# Patient Record
Sex: Female | Born: 1995 | Race: Black or African American | Hispanic: No | Marital: Single | Smoking: Never smoker
Health system: Southern US, Community
[De-identification: ages and names within clinical notes are randomized; demographics above are authoritative.]

---

## 2014-03-05 ENCOUNTER — Emergency Department: Payer: Self-pay | Admitting: Emergency Medicine

## 2014-03-05 LAB — CBC WITH DIFFERENTIAL/PLATELET
BASOS PCT: 0.4 %
Basophil #: 0 10*3/uL (ref 0.0–0.1)
Eosinophil #: 0.1 10*3/uL (ref 0.0–0.7)
Eosinophil %: 1.1 %
HCT: 36.5 % (ref 35.0–47.0)
HGB: 11.2 g/dL — ABNORMAL LOW (ref 12.0–16.0)
LYMPHS PCT: 51.7 %
Lymphocyte #: 3.8 10*3/uL — ABNORMAL HIGH (ref 1.0–3.6)
MCH: 22.9 pg — AB (ref 26.0–34.0)
MCHC: 30.6 g/dL — AB (ref 32.0–36.0)
MCV: 75 fL — AB (ref 80–100)
MONOS PCT: 3.7 %
Monocyte #: 0.3 x10 3/mm (ref 0.2–0.9)
Neutrophil #: 3.2 10*3/uL (ref 1.4–6.5)
Neutrophil %: 43.1 %
Platelet: 280 10*3/uL (ref 150–440)
RBC: 4.89 10*6/uL (ref 3.80–5.20)
RDW: 13.7 % (ref 11.5–14.5)
WBC: 7.4 10*3/uL (ref 3.6–11.0)

## 2014-03-05 LAB — BASIC METABOLIC PANEL
ANION GAP: 7 (ref 7–16)
BUN: 10 mg/dL (ref 7–18)
Calcium, Total: 9.1 mg/dL (ref 9.0–10.7)
Chloride: 105 mmol/L (ref 98–107)
Co2: 25 mmol/L (ref 21–32)
Creatinine: 0.74 mg/dL (ref 0.60–1.30)
EGFR (African American): >60
EGFR (Non-African Amer.): >60
Glucose: 101 mg/dL — ABNORMAL HIGH (ref 65–99)
Osmolality: 273 (ref 275–301)
POTASSIUM: 3.8 mmol/L (ref 3.5–5.1)
Sodium: 137 mmol/L (ref 136–145)

## 2014-03-06 LAB — URINALYSIS, COMPLETE
Bilirubin,UR: NEGATIVE
Blood: NEGATIVE
Glucose,UR: NEGATIVE mg/dL (ref 0–75)
Leukocyte Esterase: NEGATIVE
Nitrite: NEGATIVE
Ph: 6 (ref 4.5–8.0)
Protein: 100
RBC,UR: 3 /HPF (ref 0–5)
Specific Gravity: 1.021 (ref 1.003–1.030)

## 2014-03-06 LAB — TSH: Thyroid Stimulating Horm: 1.64 u[IU]/mL

## 2014-04-24 ENCOUNTER — Emergency Department: Payer: Self-pay | Admitting: Emergency Medicine

## 2014-07-16 ENCOUNTER — Emergency Department: Payer: Self-pay | Admitting: Emergency Medicine

## 2015-01-03 ENCOUNTER — Encounter: Payer: Self-pay | Admitting: Emergency Medicine

## 2015-01-03 ENCOUNTER — Emergency Department
Admission: EM | Admit: 2015-01-03 | Discharge: 2015-01-03 | Disposition: A | Discharge status: Home / Self Care | Payer: Medicaid Other | Attending: Emergency Medicine | Admitting: Emergency Medicine

## 2015-01-03 DIAGNOSIS — R1312 Dysphagia, oropharyngeal phase: Secondary | ICD-10-CM | POA: Diagnosis not present

## 2015-01-03 DIAGNOSIS — J029 Acute pharyngitis, unspecified: Secondary | ICD-10-CM | POA: Diagnosis present

## 2015-01-03 DIAGNOSIS — B349 Viral infection, unspecified: Secondary | ICD-10-CM | POA: Diagnosis not present

## 2015-01-03 LAB — POCT RAPID STREP A: Streptococcus, Group A Screen (Direct): NEGATIVE

## 2015-01-03 MED ORDER — MAGIC MOUTHWASH W/LIDOCAINE: 5.0000 mL | Freq: Four times a day (QID) | ORAL | Status: AC

## 2015-01-03 NOTE — Discharge Instructions (Signed)
Take medication as directed.

## 2015-01-03 NOTE — ED Notes (Signed)
C/o sore throat since she woke up this am

## 2015-01-03 NOTE — ED Provider Notes (Signed)
[facility] mergency Department Provider Note  ____________________________________________  Time seen: Approximately 4:04 PM  I have reviewed the triage vital signs and the nursing notes.   HISTORY  Chief Complaint Sore Throat    HPI Alison Miller is a 19 y.o. female awakened this morning with a sore throat. Patient is denying any URI signs and symptoms. Patient states she has pain with swallowing. She is rated the discomfort as a 9/10. He denies any provocative incident for this complaint. Patient has taken no medication for this complaint.   No past medical history on file.  There are no active problems to display for this patient.   No past surgical history on file.  Current Outpatient Rx  Name  Route  Sig  Dispense  Refill  . Alum & Mag Hydroxide-Simeth (MAGIC MOUTHWASH W/LIDOCAINE) SOLN   Oral   Take 5 mLs by mouth 4 (four) times daily.   100 mL   0     Allergies Review of patient's allergies indicates no known allergies.  No family history on file.  Social History History  Substance Use Topics  . Smoking status: Never Smoker   . Smokeless tobacco: Not on file  . Alcohol Use: No    Review of Systems Constitutional: No fever/chills Eyes: No visual changes. ENT: Sore throat increases with swallowing. Cardiovascular: Denies chest pain. Respiratory: Denies shortness of breath. Gastrointestinal: No abdominal pain.  No nausea, no vomiting.  No diarrhea.  No constipation. Genitourinary: Negative for dysuria. Musculoskeletal: Negative for back pain. Skin: Negative for rash. Neurological: Negative for headaches, focal weakness or numbness. 10-point ROS otherwise negative.  ____________________________________________   PHYSICAL EXAM:  VITAL SIGNS: ED Triage Vitals  Enc Vitals Group     BP 01/03/15 1547 105/68 mmHg     Pulse Rate 01/03/15 1547 74     Resp 01/03/15 1547 18     Temp 01/03/15 1547 98.9 F (37.2 C)     Temp  Source 01/03/15 1547 Oral     SpO2 01/03/15 1547 100 %     Weight 01/03/15 1547 118 lb (53.524 kg)     Height 01/03/15 1547 5\' 6"  (1.676 m)     Head Cir --      Peak Flow --      Pain Score 01/03/15 1548 9     Pain Loc --      Pain Edu? --      Excl. in GC? --     Constitutional: Alert and oriented. Well appearing and in no acute distress. Eyes: Conjunctivae are normal. PERRL. EOMI. Head: Atraumatic. Nose: No congestion/rhinnorhea. Mouth/Throat: Mucous membranes are moist.  Oropharynx non-erythematous. Neck: No stridor. Full and equal range of motion. Hematological/Lymphatic/Immunilogical: No cervical lymphadenopathy. Cardiovascular: Normal rate, regular rhythm. Grossly normal heart sounds.  Good peripheral circulation. Respiratory: Normal respiratory effort.  No retractions. Lungs CTAB. Gastrointestinal: Soft and nontender. No distention. No abdominal bruits. No CVA tenderness. Musculoskeletal: No lower extremity tenderness nor edema.  No joint effusions. Neurologic:  Normal speech and language. No gross focal neurologic deficits are appreciated. Speech is normal. No gait instability. Skin:  Skin is warm, dry and intact. No rash noted. Psychiatric: Mood and affect are normal. Speech and behavior are normal.  ____________________________________________   LABS (all labs ordered are listed, but only abnormal results are displayed)  Labs Reviewed  CULTURE, GROUP A STREP ([facility])  POCT RAPID STREP A ([facility] ONLY)  POCT RAPID STREP A ([facility] ONLY)  ____________________________________________  EKG   ____________________________________________  RADIOLOGY   ____________________________________________   PROCEDURES  Procedure(s) performed: None  Critical Care performed: No  ____________________________________________   INITIAL IMPRESSION / ASSESSMENT AND PLAN / ED COURSE  Pertinent labs & imaging results that were available during my care of the  patient were reviewed by me and considered in my medical decision making (see chart for details).  Pharyngitis ____________________________________________   FINAL CLINICAL IMPRESSION(S) / ED DIAGNOSES  Final diagnoses:  Pharyngitis with viral syndrome  Dysphagia, oropharyngeal      Joni ReiningRonald K Marg Macmaster, PA-C 01/03/15 1636  Loleta Roseory Forbach, MD 01/03/15 2332

## 2015-01-03 NOTE — ED Notes (Signed)
C/o sorethroat since last pm

## 2015-01-06 LAB — CULTURE, GROUP A STREP (THRC)

## 2015-05-28 ENCOUNTER — Emergency Department
Admission: EM | Admit: 2015-05-28 | Discharge: 2015-05-28 | Disposition: A | Discharge status: Home / Self Care | Payer: Medicaid Other | Attending: Emergency Medicine | Admitting: Emergency Medicine

## 2015-05-28 ENCOUNTER — Encounter: Payer: Self-pay | Admitting: Emergency Medicine

## 2015-05-28 DIAGNOSIS — J302 Other seasonal allergic rhinitis: Secondary | ICD-10-CM | POA: Insufficient documentation

## 2015-05-28 DIAGNOSIS — R05 Cough: Secondary | ICD-10-CM | POA: Diagnosis present

## 2015-05-28 DIAGNOSIS — K529 Noninfective gastroenteritis and colitis, unspecified: Secondary | ICD-10-CM | POA: Diagnosis not present

## 2015-05-28 MED ORDER — LOPERAMIDE HCL 2 MG PO CAPS
2.0000 mg | ORAL_CAPSULE | Freq: Once | ORAL | Status: AC
  Administered 2015-05-28: 2 mg via ORAL
  Filled 2015-05-28: qty 1

## 2015-05-28 MED ORDER — ONDANSETRON 4 MG PO TBDP
4.0000 mg | ORAL_TABLET | Freq: Once | ORAL | Status: AC
  Administered 2015-05-28: 4 mg via ORAL
  Filled 2015-05-28: qty 1

## 2015-05-28 MED ORDER — ONDANSETRON HCL 4 MG PO TABS: 4.0000 mg | ORAL_TABLET | Freq: Four times a day (QID) | ORAL | Status: AC | PRN

## 2015-05-28 NOTE — ED Notes (Signed)
AAOx3.  Skin warm and dry.  NAD 

## 2015-05-28 NOTE — ED Notes (Signed)
Pt states she has had cold symptoms since Sunday. Per patient she has a sore throat and it is painful to swallow. Pt also c/o runny nose and constant sneezing.

## 2015-05-28 NOTE — ED Provider Notes (Signed)
[facility] Emergency Department Provider Note ____________________________________________  Time seen: 46  I have reviewed the triage vital signs and the nursing notes.  HISTORY  Chief Complaint  URI  HPI Quetzalli Clos is a 19 y.o. female reports to the ED for evaluation of cold symptoms onset since Sunday. She describes intermittent sneezing, coughing, sore throat, and runny nose. She also reports today she had an episode of spontaneous diarrhea, or resting on the couch. She has dosed DayQuil for her symptom relief 1 dose yesterday. She is without any fevers, chills, sweats, or shortness of breath. She denies any sick contacts, recent travel, or bad foods. She is here today for evaluation of her symptoms.She rates her discomfort, which is primarily in the throat at a 5/10 in triage.  History reviewed. No pertinent past medical history.  There are no active problems to display for this patient.  History reviewed. No pertinent past surgical history.  Current Outpatient Rx  Name  Route  Sig  Dispense  Refill  . ondansetron (ZOFRAN) 4 MG tablet   Oral   Take 1 tablet (4 mg total) by mouth every 6 (six) hours as needed for nausea or vomiting.   15 tablet   0    Allergies Review of patient's allergies indicates no known allergies.  History reviewed. No pertinent family history.  Social History Social History  Substance Use Topics  . Smoking status: Never Smoker   . Smokeless tobacco: None  . Alcohol Use: Yes     Comment: Occasionally   Review of Systems  Constitutional: Negative for fever. Eyes: Negative for visual changes. ENT: Positive for sore throat and rhinitis as above. Cardiovascular: Negative for chest pain. Respiratory: Negative for shortness of breath. Gastrointestinal: Negative for abdominal pain and vomiting; reports nausea and diarrhea. Genitourinary: Negative for dysuria. Musculoskeletal: Negative for back pain. Skin: Negative  for rash. Neurological: Negative for headaches, focal weakness or numbness. ____________________________________________  PHYSICAL EXAM:  VITAL SIGNS: ED Triage Vitals  Enc Vitals Group     BP 05/28/15 1823 113/62 mmHg     Pulse Rate 05/28/15 1823 78     Resp 05/28/15 1823 20     Temp 05/28/15 1823 98.9 F (37.2 C)     Temp Source 05/28/15 1823 Oral     SpO2 05/28/15 1823 100 %     Weight 05/28/15 1823 123 lb (55.792 kg)     Height 05/28/15 1823  (1.702 m)     Head Cir --      Peak Flow --      Pain Score 05/28/15 1824 5     Pain Loc --      Pain Edu? --      Excl. in GC? --    Constitutional: Alert and oriented. Well appearing and in no distress. Eyes: Conjunctivae are normal. PERRL. Normal extraocular movements. ENT   Head: Normocephalic and atraumatic.   Nose: No congestion/rhinorrhea.   Mouth/Throat: Mucous membranes are moist.   Neck: Supple. No thyromegaly. Hematological/Lymphatic/Immunological: No cervical lymphadenopathy. Cardiovascular: Normal rate, regular rhythm.  Respiratory: Normal respiratory effort. No wheezes/rales/rhonchi. Gastrointestinal: Soft and nontender. No distention, rebound, guarding, organomegaly. Hyperactive bowel sounds 4 quadrants. Musculoskeletal: Nontender with normal range of motion in all extremities.  Neurologic:  Normal gait without ataxia. Normal speech and language. No gross focal neurologic deficits are appreciated. Skin:  Skin is warm, dry and intact. No rash noted. Psychiatric: Mood and affect are normal. Patient exhibits appropriate insight and judgment. ____________________________________________  PROCEDURES  Imodium 2 mg p.o. Zofran 4 mg ODT p.o. ____________________________________________  INITIAL IMPRESSION / ASSESSMENT AND PLAN / ED COURSE  Acute gastroenteritis symptoms with onset today. Other symptoms consistent with rhinitis and seasonal allergies likely. Patient to dose over-the-counter allergy  medication for symptom relief. She is provided with a prescription for Zofran as needed for nausea, and advised to dose over-the-counter Imodium AD as needed for ongoing diarrhea symptoms. She tried with a school note to remain out of school until Monday. ____________________________________________  FINAL CLINICAL IMPRESSION(S) / ED DIAGNOSES  Final diagnoses:  Other seasonal allergic rhinitis  Gastroenteritis, acute      Lissa Hoard, PA-C 05/28/15 1921  Darien Ramus, MD 05/28/15 2352

## 2015-05-28 NOTE — Discharge Instructions (Signed)
Allergies An allergy is when your body reacts to a substance in a way that is not normal. An allergic reaction can happen after you:  Eat something.  Breathe in something.  Touch something. WHAT KINDS OF ALLERGIES ARE THERE? You can be allergic to:  Things that are only around during certain seasons, like molds and pollens.  Foods.  Drugs.  Insects.  Animal dander. WHAT ARE SYMPTOMS OF ALLERGIES?  Puffiness (swelling). This may happen on the lips, face, tongue, mouth, or throat.  Sneezing.  Coughing.  Breathing loudly (wheezing).  Stuffy nose.  Tingling in the mouth.  A rash.  Itching.  Itchy, red, puffy areas of skin (hives).  Watery eyes.  Throwing up (vomiting).  Watery poop (diarrhea).  Dizziness.  Feeling faint or fainting.  Trouble breathing or swallowing.  A tight feeling in the chest.  A fast heartbeat. HOW ARE ALLERGIES DIAGNOSED? Allergies can be diagnosed with:  A medical and family history.  Skin tests.  Blood tests.  A food diary. A food diary is a record of all the foods, drinks, and symptoms you have each day.  The results of an elimination diet. This diet involves making sure not to eat certain foods and then seeing what happens when you start eating them again. HOW ARE ALLERGIES TREATED? There is no cure for allergies, but allergic reactions can be treated with medicine. Severe reactions usually need to be treated at a hospital.  HOW CAN REACTIONS BE PREVENTED? The best way to prevent an allergic reaction is to avoid the thing you are allergic to. Allergy shots and medicines can also help prevent reactions in some cases.   This information is not intended to replace advice given to you by your health care provider. Make sure you discuss any questions you have with your health care provider.   Document Released: 12/04/2012 Document Revised: 08/30/2014 Document Reviewed: 05/21/2014 Elsevier Interactive Patient Education 2016  [employer].  Food Choices to Help Relieve Diarrhea, Adult When you have diarrhea, the foods you eat and your eating habits are very important. Choosing the right foods and drinks can help relieve diarrhea. Also, because diarrhea can last up to 7 days, you need to replace lost fluids and electrolytes (such as sodium, potassium, and chloride) in order to help prevent dehydration.  WHAT GENERAL GUIDELINES DO I NEED TO FOLLOW?  Slowly drink 1 cup (8 oz) of fluid for each episode of diarrhea. If you are getting enough fluid, your urine will be clear or pale yellow.  Eat starchy foods. Some good choices include white rice, white toast, pasta, low-fiber cereal, baked potatoes (without the skin), saltine crackers, and bagels.  Avoid large servings of any cooked vegetables.  Limit fruit to two servings per day. A serving is  cup or 1 small piece.  Choose foods with less than 2 g of fiber per serving.  Limit fats to less than 8 tsp (38 g) per day.  Avoid fried foods.  Eat foods that have probiotics in them. Probiotics can be found in certain dairy products.  Avoid foods and beverages that may increase the speed at which food moves through the stomach and intestines (gastrointestinal tract). Things to avoid include:  High-fiber foods, such as dried fruit, raw fruits and vegetables, nuts, seeds, and whole grain foods.  Spicy foods and high-fat foods.  Foods and beverages sweetened with high-fructose corn syrup, honey, or sugar alcohols such as xylitol, sorbitol, and mannitol. WHAT FOODS ARE RECOMMENDED? Grains White rice. White,  [place], or pita breads (fresh or toasted), including plain rolls, buns, or bagels. White pasta. Saltine, soda, or graham crackers. Pretzels. Low-fiber cereal. Cooked cereals made with water (such as cornmeal, farina, or cream cereals). Plain muffins. Matzo. Melba toast. Zwieback.  Vegetables Potatoes (without the skin). Strained tomato and vegetable juices. Most  well-cooked and canned vegetables without seeds. Tender lettuce. Fruits Cooked or canned applesauce, apricots, cherries, fruit cocktail, grapefruit, peaches, pears, or plums. Fresh bananas, apples without skin, cherries, grapes, cantaloupe, grapefruit, peaches, oranges, or plums.  Meat and Other Protein Products Baked or boiled chicken. Eggs. Tofu. Fish. Seafood. Smooth peanut butter. Ground or well-cooked tender beef, ham, veal, lamb, pork, or poultry.  Dairy Plain yogurt, kefir, and unsweetened liquid yogurt. Lactose-free milk, buttermilk, or soy milk. Plain hard cheese. Beverages Sport drinks. Clear broths. Diluted fruit juices (except prune). Regular, caffeine-free sodas such as ginger ale. Water. Decaffeinated teas. Oral rehydration solutions. Sugar-free beverages not sweetened with sugar alcohols. Other Bouillon, broth, or soups made from recommended foods.  The items listed above may not be a complete list of recommended foods or beverages. Contact your dietitian for more options. WHAT FOODS ARE NOT RECOMMENDED? Grains Whole grain, whole wheat, bran, or rye breads, rolls, pastas, crackers, and cereals. Wild or brown rice. Cereals that contain more than 2 g of fiber per serving. Corn tortillas or taco shells. Cooked or dry oatmeal. Granola. Popcorn. Vegetables Raw vegetables. Cabbage, broccoli, Brussels sprouts, artichokes, baked beans, beet greens, corn, kale, legumes, peas, sweet potatoes, and yams. Potato skins. Cooked spinach and cabbage. Fruits Dried fruit, including raisins and dates. Raw fruits. Stewed or dried prunes. Fresh apples with skin, apricots, mangoes, pears, raspberries, and strawberries.  Meat and Other Protein Products Chunky peanut butter. Nuts and seeds. Beans and lentils. Tomasa Blase.  Dairy High-fat cheeses. Milk, chocolate milk, and beverages made with milk, such as milk shakes. Cream. Ice cream. Sweets and Desserts Sweet rolls, doughnuts, and sweet breads. Pancakes  and waffles. Fats and Oils Butter. Cream sauces. Margarine. Salad oils. Plain salad dressings. Olives. Avocados.  Beverages Caffeinated beverages (such as coffee, tea, soda, or energy drinks). Alcoholic beverages. Fruit juices with pulp. Prune juice. Soft drinks sweetened with high-fructose corn syrup or sugar alcohols. Other Coconut. Hot sauce. Chili powder. Mayonnaise. Gravy. Cream-based or milk-based soups.  The items listed above may not be a complete list of foods and beverages to avoid. Contact your dietitian for more information. WHAT SHOULD I DO IF I BECOME DEHYDRATED? Diarrhea can sometimes lead to dehydration. Signs of dehydration include dark urine and dry mouth and skin. If you think you are dehydrated, you should rehydrate with an oral rehydration solution. These solutions can be purchased at pharmacies, retail stores, or online.  Drink -1 cup (120-240 mL) of oral rehydration solution each time you have an episode of diarrhea. If drinking this amount makes your diarrhea worse, try drinking smaller amounts more often. For example, drink 1-3 tsp (5-15 mL) every 5-10 minutes.  A general rule for staying hydrated is to drink 1-2 L of fluid per day. Talk to your health care provider about the specific amount you should be drinking each day. Drink enough fluids to keep your urine clear or pale yellow.   This information is not intended to replace advice given to you by your health care provider. Make sure you discuss any questions you have with your health care provider.   Document Released: 10/30/2003 Document Revised: 08/30/2014 Document Reviewed: 07/02/2013 Elsevier Interactive Patient Education 2016 Elsevier  Inc. ° °

## 2015-05-28 NOTE — ED Notes (Signed)
AAOx3.  Skin warm and dry.  Moving all extremities.  Ambulates with easy and steady gait.

## 2015-12-10 ENCOUNTER — Emergency Department: Payer: Medicaid Other

## 2015-12-10 ENCOUNTER — Encounter: Payer: Self-pay | Admitting: Medical Oncology

## 2015-12-10 ENCOUNTER — Emergency Department
Admission: EM | Admit: 2015-12-10 | Discharge: 2015-12-10 | Disposition: A | Discharge status: Home / Self Care | Payer: Medicaid Other | Attending: Student | Admitting: Student

## 2015-12-10 DIAGNOSIS — Y999 Unspecified external cause status: Secondary | ICD-10-CM | POA: Insufficient documentation

## 2015-12-10 DIAGNOSIS — Z791 Long term (current) use of non-steroidal anti-inflammatories (NSAID): Secondary | ICD-10-CM | POA: Diagnosis not present

## 2015-12-10 DIAGNOSIS — Y92009 Unspecified place in unspecified non-institutional (private) residence as the place of occurrence of the external cause: Secondary | ICD-10-CM | POA: Diagnosis not present

## 2015-12-10 DIAGNOSIS — W2201XA Walked into wall, initial encounter: Secondary | ICD-10-CM | POA: Diagnosis not present

## 2015-12-10 DIAGNOSIS — S96911A Strain of unspecified muscle and tendon at ankle and foot level, right foot, initial encounter: Secondary | ICD-10-CM | POA: Diagnosis not present

## 2015-12-10 DIAGNOSIS — S99921A Unspecified injury of right foot, initial encounter: Secondary | ICD-10-CM | POA: Diagnosis present

## 2015-12-10 DIAGNOSIS — Y9302 Activity, running: Secondary | ICD-10-CM | POA: Insufficient documentation

## 2015-12-10 MED ORDER — MELOXICAM 15 MG PO TABS: 15.0000 mg | ORAL_TABLET | Freq: Every day | ORAL | Status: AC

## 2015-12-10 NOTE — ED Notes (Signed)
PT injured pinky toe on rt foot today.

## 2015-12-10 NOTE — ED Provider Notes (Signed)
[facility] Emergency Department Provider Note  ____________________________________________  Time seen: Approximately 2:25 PM  I have reviewed the triage vital signs and the nursing notes.   HISTORY  Chief Complaint Toe Injury    HPI Alison Miller is a 20 y.o. female complaining of right 5th toe pain following an injury incurred this morning. The patient states she was running through her home and kicked a wall. She admits to sharp pain, numbness and tingling in the affected toe and difficulty walking. She denies falling from the injury or head trauma, ankle pain, calf pain or previous injury to the affected foot.   History reviewed. No pertinent past medical history.  There are no active problems to display for this patient.   History reviewed. No pertinent past surgical history.  Current Outpatient Rx  Name  Route  Sig  Dispense  Refill  . meloxicam (MOBIC) 15 MG tablet   Oral   Take 1 tablet (15 mg total) by mouth daily.   30 tablet   0   . ondansetron (ZOFRAN) 4 MG tablet   Oral   Take 1 tablet (4 mg total) by mouth every 6 (six) hours as needed for nausea or vomiting.   15 tablet   0     Allergies Review of patient's allergies indicates no known allergies.  No family history on file.  Social History Social History  Substance Use Topics  . Smoking status: Never Smoker   . Smokeless tobacco: None  . Alcohol Use: Yes     Comment: Occasionally    Review of Systems  Musculoskeletal: Negative for left lower extremity pain, right ankle pain, right knee pain. Positive for left foot pain localized to the 5th toe. Skin: negative for bruising of the right foot.  Neurological: Negative for headaches, focal weakness or numbness. Some numbness in the right 5th toe 10-point ROS otherwise negative.  ____________________________________________   PHYSICAL EXAM:  VITAL SIGNS: ED Triage Vitals  Enc Vitals Group     BP 12/10/15 1328  111/60 mmHg     Pulse Rate 12/10/15 1328 64     Resp 12/10/15 1328 16     Temp 12/10/15 1328 98.4 F (36.9 C)     Temp Source 12/10/15 1328 Oral     SpO2 12/10/15 1328 100 %     Weight 12/10/15 1328 125 lb (56.7 kg)     Height 12/10/15 1328 5\' 6"  (1.676 m)     Head Cir --      Peak Flow --      Pain Score 12/10/15 1327 9     Pain Loc --      Pain Edu? --      Excl. in GC? --     Constitutional: Alert and oriented. Well appearing and in no acute distress. Musculoskeletal: No lower extremity tenderness nor edema.  No joint effusions. Mild swelling noted in the right 5th toe, no bruising or obvious bony abnormalities.  Neurologic:  Normal speech and language. No gross focal neurologic deficits are appreciated.  Skin:  Skin is warm, dry and intact. No rash noted. Psychiatric: Mood and affect are normal. Speech and behavior are normal.  ____________________________________________   LABS (all labs ordered are listed, but only abnormal results are displayed)  Labs Reviewed - No data to display  RADIOLOGY  Right Foot Complete 3+ view- negative for acute bony abnormality ____________________________________________   PROCEDURES  Procedure(s) performed: Radiology, see procedure note(s).  Critical Care performed: No  ____________________________________________  INITIAL IMPRESSION / ASSESSMENT AND PLAN / ED COURSE  Pertinent labs & imaging results that were available during my care of the patient were reviewed by me and considered in my medical decision making (see chart for details).  [name] is a 20 yo female presenting with right 5th toe pain following an injury earlier this morning. Right foot complete imaging shows no acute bony abnormalities. Right 5th toe strain following injury. Rx given for meloxicam 50 mg daily. Patient follow-up with PCP or return to the ER with any worsening symptomology. ____________________________________________   FINAL CLINICAL  IMPRESSION(S) / ED DIAGNOSES  Final diagnoses:  Strain of fifth toe, right, initial encounter     Evangeline Dakin, PA-C 12/10/15 1722  Emily Filbert, MD 12/10/15 [phone]

## 2015-12-10 NOTE — Discharge Instructions (Signed)

## 2016-05-06 IMAGING — CT CT HEAD WITHOUT CONTRAST
1 series · 16 of 30 positions shown, 20 images · non-contrast
Comparison: None.

CLINICAL DATA: Syncope with recent trauma; headache

EXAM:
CT HEAD WITHOUT CONTRAST
TECHNIQUE: Contiguous axial images were obtained from the base of the skull
through the vertex without intravenous contrast.

[Series 2: head wo · axial · 0.39mm/px · z∈[+436,+562]mm · 16 of 32 slices shown, 20 images]
[im 2/32  brain]
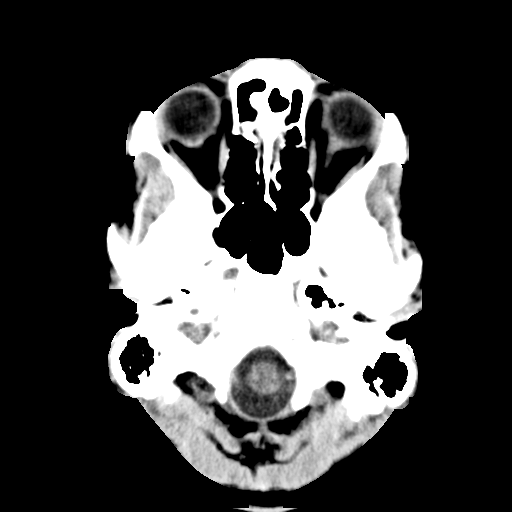
[im 2/32  bone]
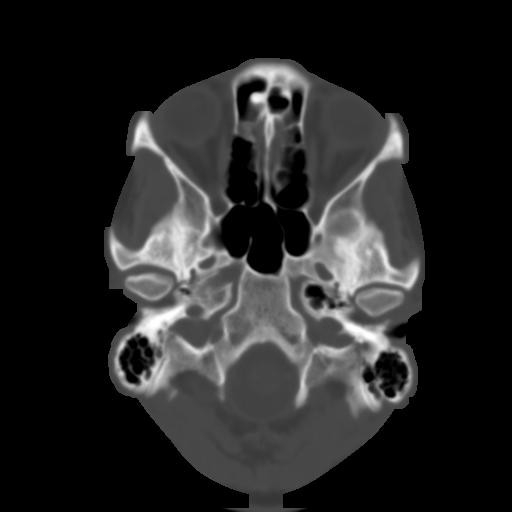
[im 4/32  brain]
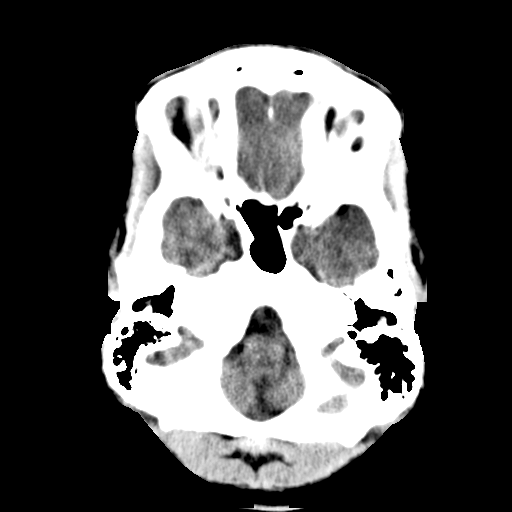
[im 6/32  brain]
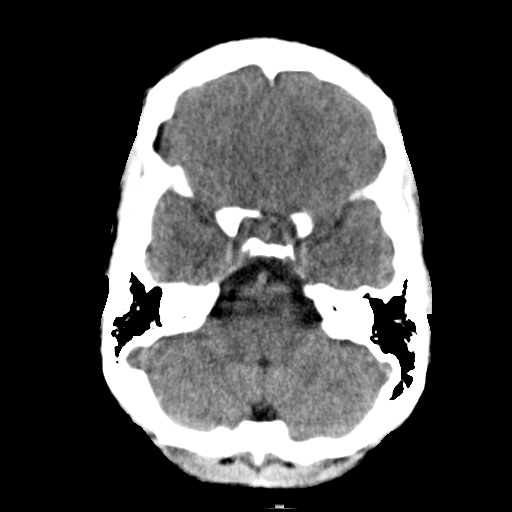
[im 8/32  brain]
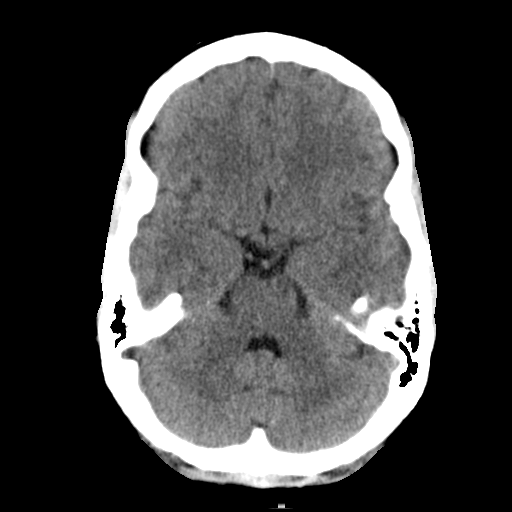
[im 9/32  brain]
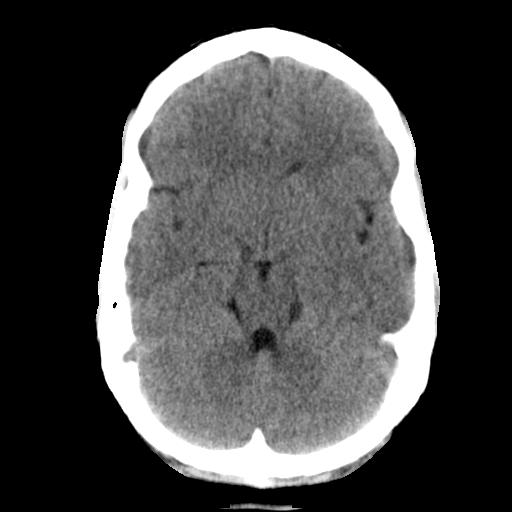
[im 9/32  bone]
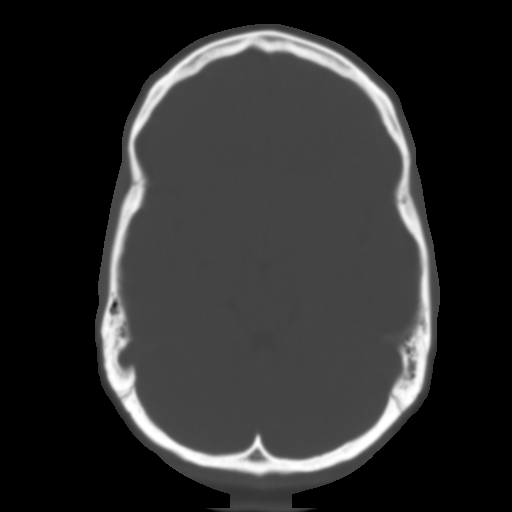
[im 11/32  brain]
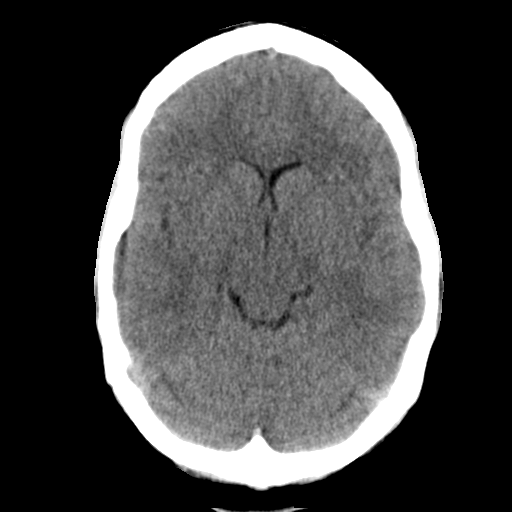
[im 13/32  brain]
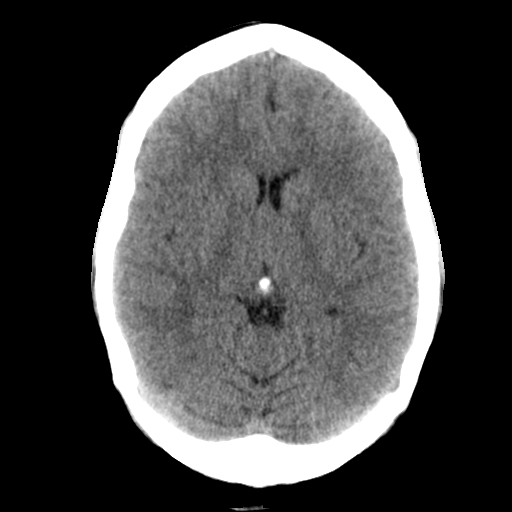
[im 15/32  brain]
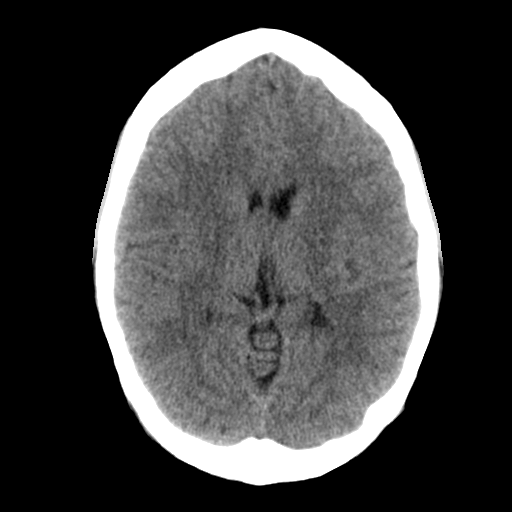
[im 17/32  brain]
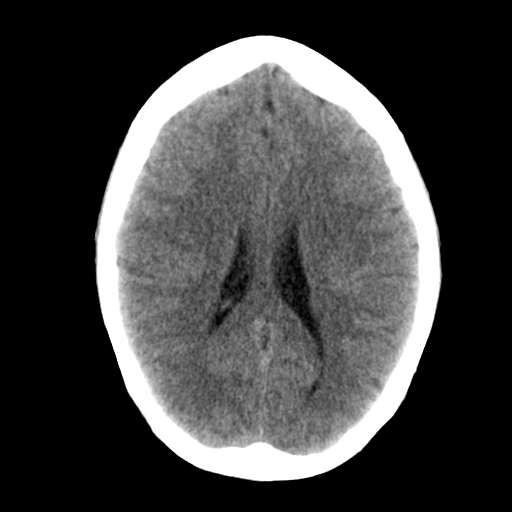
[im 17/32  bone]
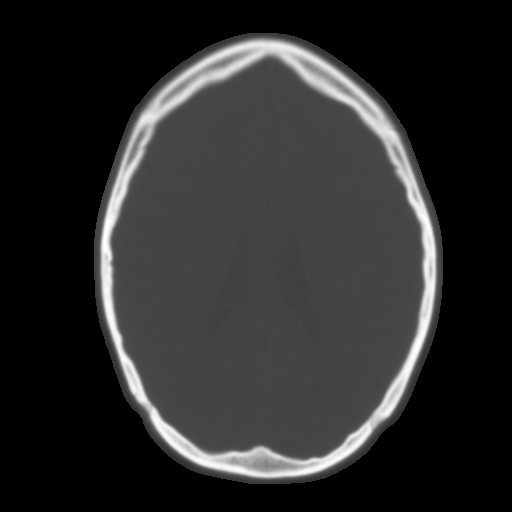
[im 19/32  brain]
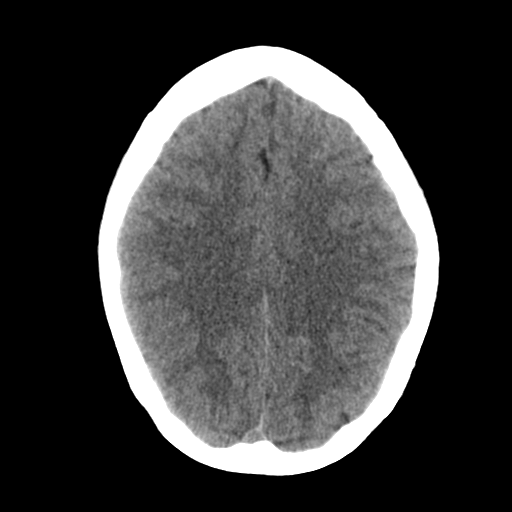
[im 21/32  brain]
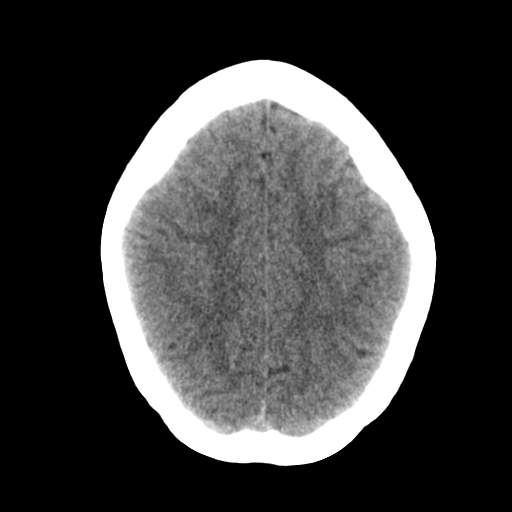
[im 23/32  brain]
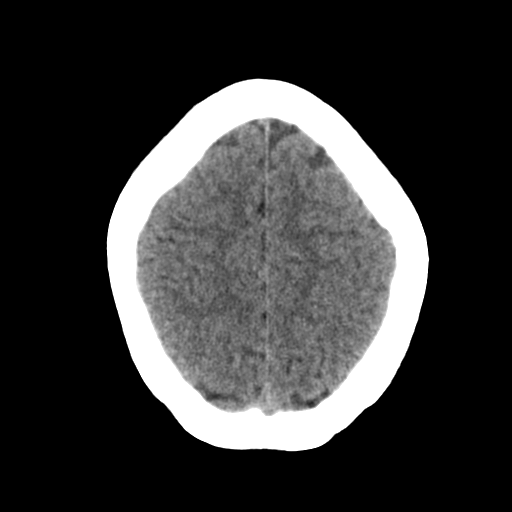
[im 24/32  brain]
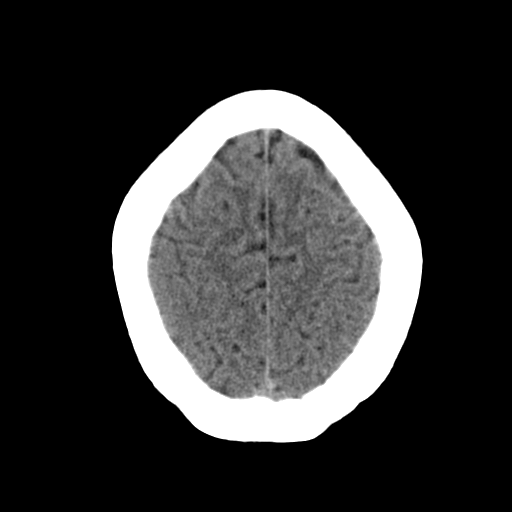
[im 24/32  bone]
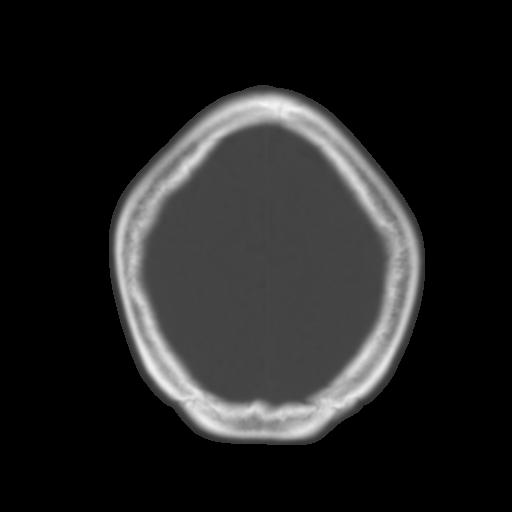
[im 26/32  brain]
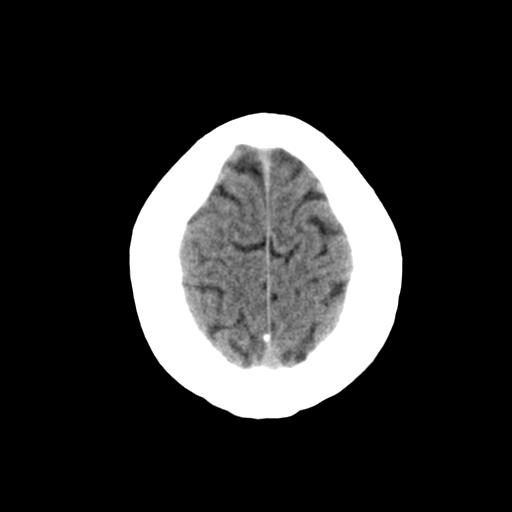
[im 28/32  brain]
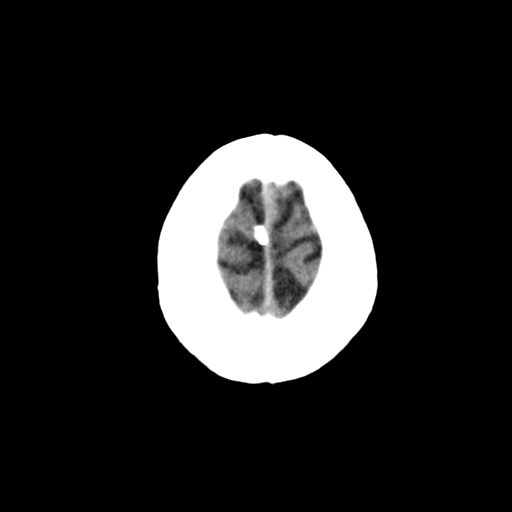
[im 30/32  brain]
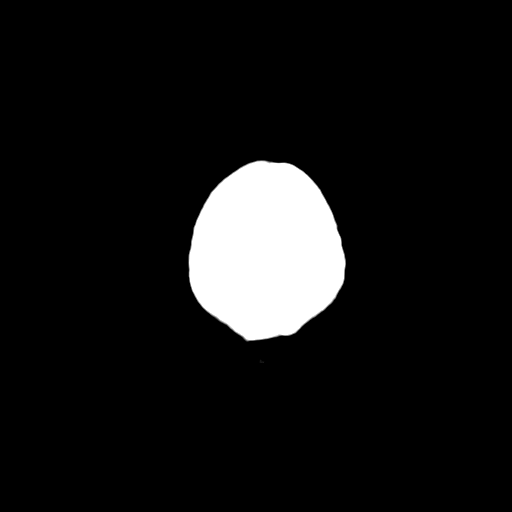

[16 of 30 positions shown; findings below may reference images not displayed]

FINDINGS: The ventricles are normal in size and configuration. There is no
mass, hemorrhage, extra-axial fluid collection, or midline shift.
Gray-white compartments appear normal. No acute infarct apparent.
Bony calvarium appears intact. Visualized mastoid air cells are
clear. There is debris in both external auditory canals.
IMPRESSION: Probable cerumen in each external auditory canal. Study otherwise
unremarkable. In particular, no mass, hemorrhage, or focal gray/
white compartment lesion.

## 2016-06-25 IMAGING — CR RIGHT LITTLE FINGER 2+V
1 series · 3 of 3 positions shown · non-contrast
Comparison: None.

CLINICAL DATA: c/o general pain in RT shoulder, RT pinky and upper
back x 2 months. pt states no injury

EXAM:
RIGHT LITTLE FINGER 2+V

[Series 1: dxr finger pinky 5th digit rt ha · 0.14mm/px · 3 of 3 slices shown]
[im 1/3]
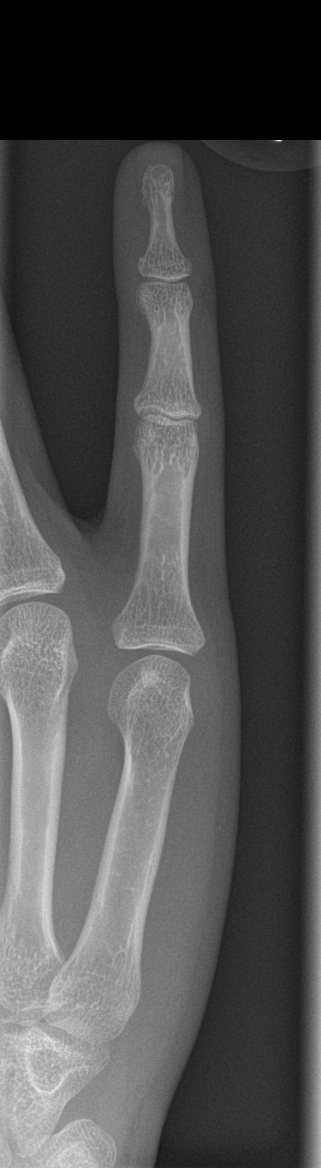
[im 2/3]
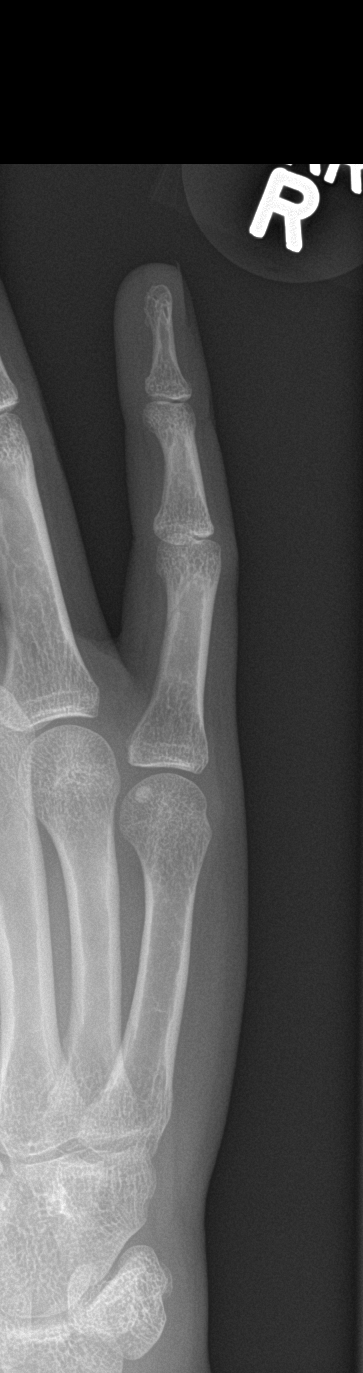
[im 3/3]
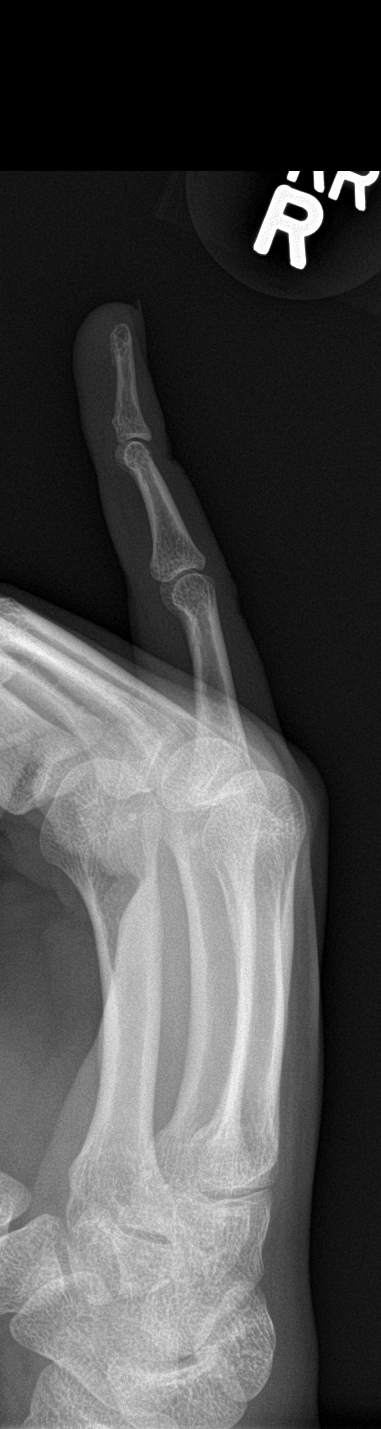

[3 of 3 positions shown; findings below may reference images not displayed]

FINDINGS: There is no evidence of fracture or dislocation. There is no
evidence of arthropathy or other focal bone abnormality. Soft
tissues are unremarkable. No radiodense foreign body.
IMPRESSION: Negative.

## 2016-06-25 IMAGING — CR DG SHOULDER 3+V*R*
1 series · 3 of 3 positions shown · non-contrast
Comparison: None.

CLINICAL DATA: c/o general pain in RT shoulder, RT pinky and upper
back x 2 months. pt states no injury

EXAM:
DG SHOULDER 3+ VIEWS RIGHT

[Series 1: dxr shoulder right complete · 0.14mm/px · 3 of 3 slices shown]
[im 1/3]
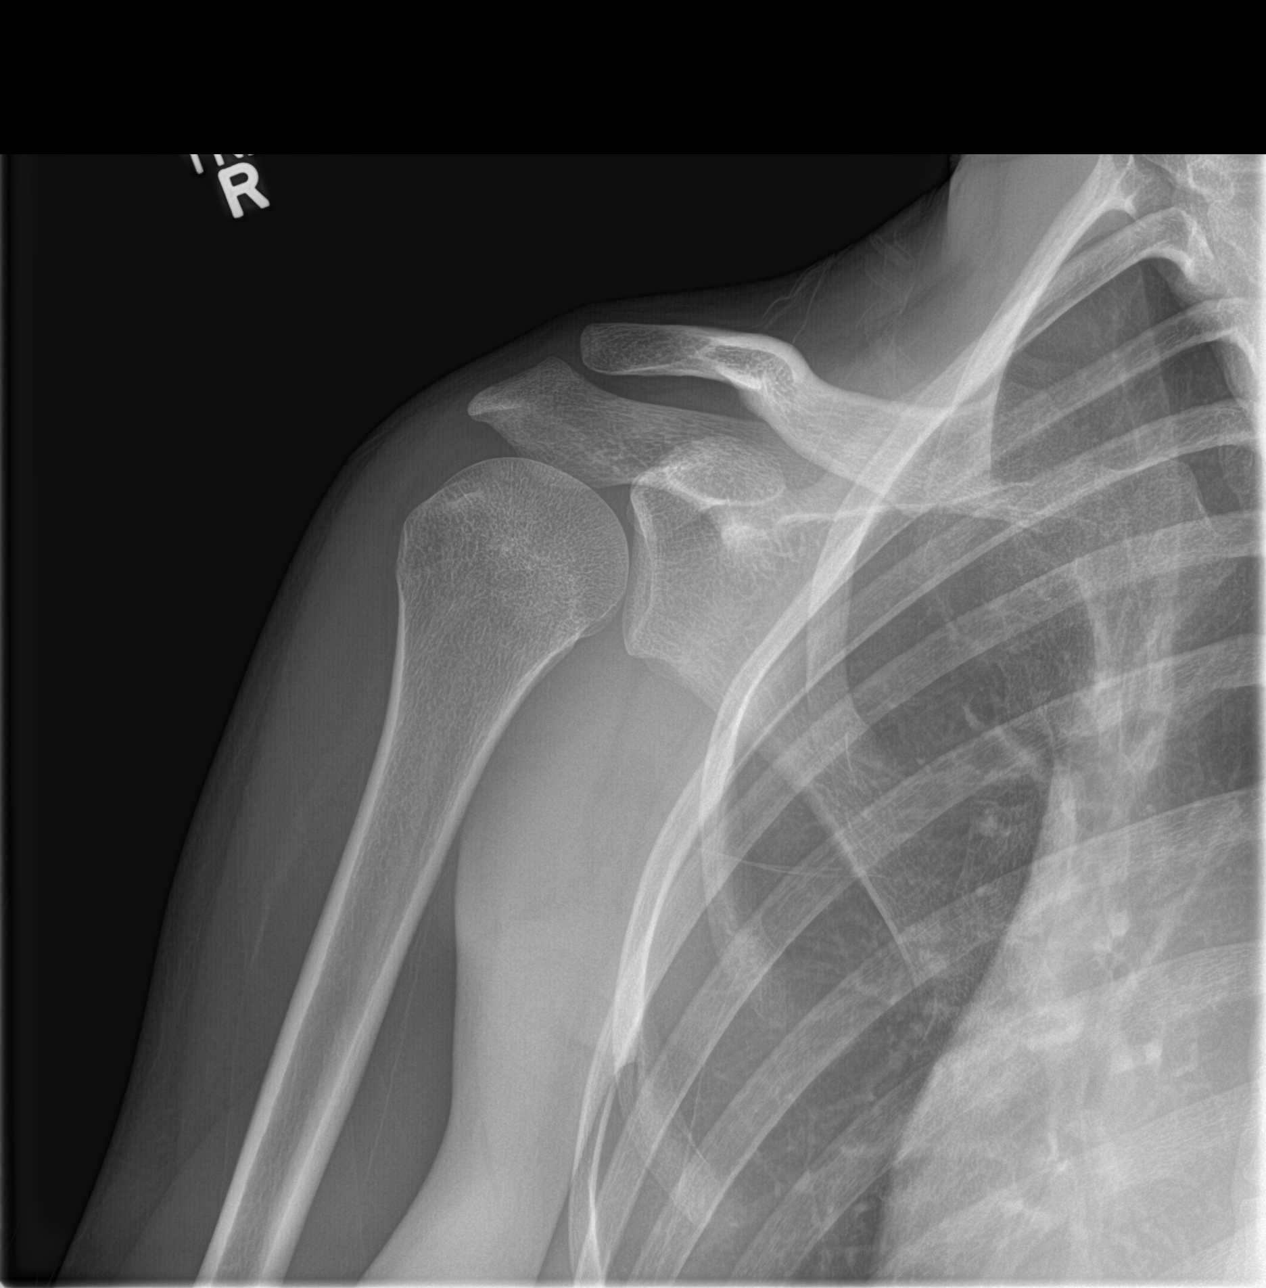
[im 2/3]
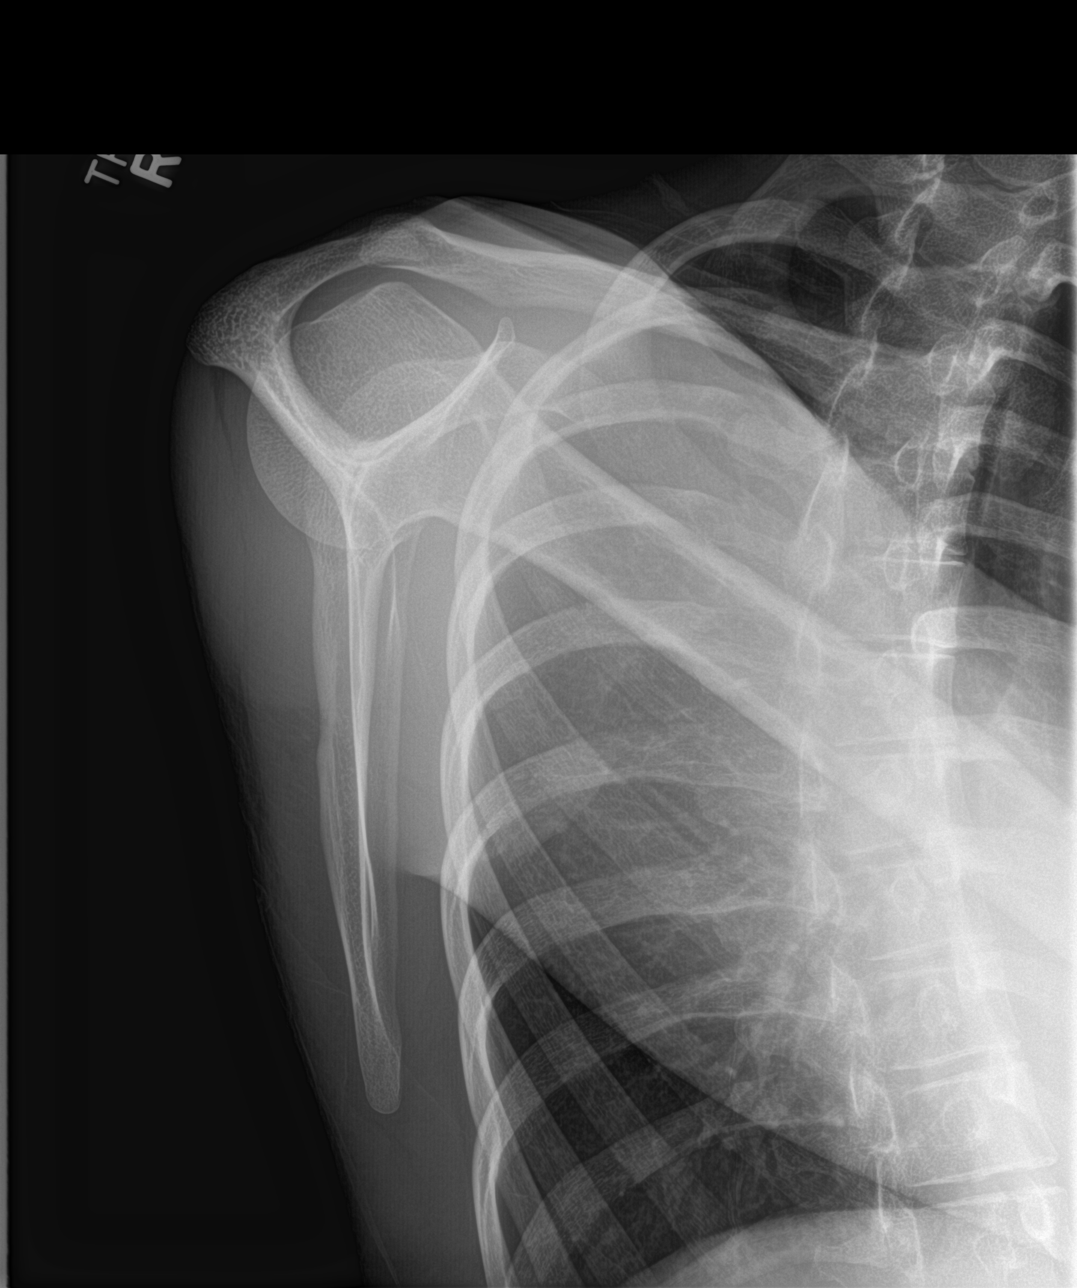
[im 3/3]
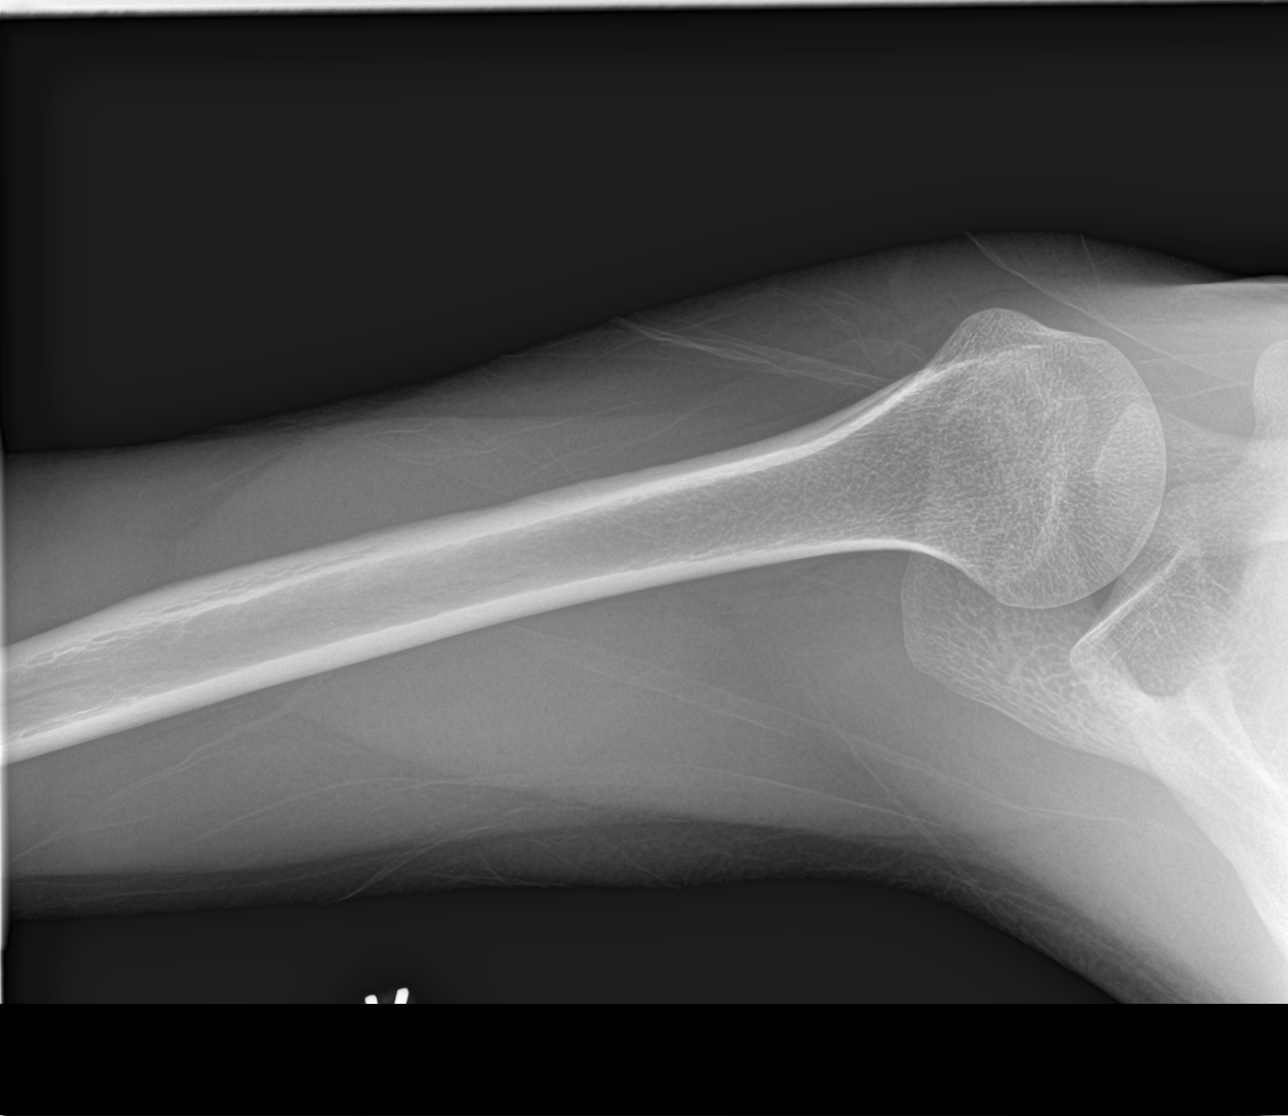

[3 of 3 positions shown; findings below may reference images not displayed]

FINDINGS: There is no evidence of fracture or dislocation. There is no
evidence of arthropathy or other focal bone abnormality. Soft
tissues are unremarkable.
IMPRESSION: Negative.

## 2016-07-19 ENCOUNTER — Emergency Department
Admission: EM | Admit: 2016-07-19 | Discharge: 2016-07-19 | Disposition: A | Discharge status: Home / Self Care | Payer: Medicaid Other | Attending: Emergency Medicine | Admitting: Emergency Medicine

## 2016-07-19 ENCOUNTER — Encounter: Payer: Self-pay | Admitting: Emergency Medicine

## 2016-07-19 DIAGNOSIS — R05 Cough: Secondary | ICD-10-CM | POA: Diagnosis present

## 2016-07-19 DIAGNOSIS — J069 Acute upper respiratory infection, unspecified: Secondary | ICD-10-CM

## 2016-07-19 LAB — INFLUENZA PANEL BY PCR (TYPE A & B)
Influenza A By PCR: NEGATIVE
Influenza B By PCR: NEGATIVE

## 2016-07-19 MED ORDER — OSELTAMIVIR PHOSPHATE 75 MG PO CAPS: 75.0000 mg | ORAL_CAPSULE | Freq: Two times a day (BID) | ORAL | 0 refills | Status: AC

## 2016-07-19 MED ORDER — NAPROXEN 500 MG PO TABS
500.0000 mg | ORAL_TABLET | Freq: Once | ORAL | Status: AC
  Administered 2016-07-19: 500 mg via ORAL
  Filled 2016-07-19: qty 1

## 2016-07-19 MED ORDER — NAPROXEN 500 MG PO TBEC: 500.0000 mg | DELAYED_RELEASE_TABLET | Freq: Two times a day (BID) | ORAL | 0 refills | Status: AC

## 2016-07-19 NOTE — ED Notes (Signed)
Pt complains of generalize body aches, fever yesterday, cough and congestion and headache.

## 2016-07-19 NOTE — ED Provider Notes (Signed)
[facility] Emergency Department Provider Note  ____________________________________________   First MD Initiated Contact with Patient 07/19/16 1518     (approximate)  I have reviewed the triage vital signs and the nursing notes.   HISTORY  Chief Complaint Generalized Body Aches and Cough    HPI Alison Miller is a 20 y.o. female presenting with fatigue, myalgias, productive cough, diarrhea, headache worse at night and fatigue for 1 day. Patient denies recent sick contacts. She is not currently working or in school. She denies shortness of breath, chest pain, nausea, vomiting, dysuria or hematuria. She has not attempted alleviating factors. Symptoms seem to be worsening. Patient's temperature has been as high as 100.1 assessed orally.   History reviewed. No pertinent past medical history.  There are no active problems to display for this patient.   History reviewed. No pertinent surgical history.  Prior to Admission medications   Medication Sig Start Date End Date Taking? Authorizing Provider  meloxicam (MOBIC) 15 MG tablet Take 1 tablet (15 mg total) by mouth daily. 12/10/15   Evangeline Dakinharles M Beers, PA-C  naproxen (EC NAPROSYN) 500 MG EC tablet Take 1 tablet (500 mg total) by mouth 2 (two) times daily with a meal. 07/19/16 07/19/17  Orvil FeilJaclyn M Tait Balistreri, PA-C  ondansetron (ZOFRAN) 4 MG tablet Take 1 tablet (4 mg total) by mouth every 6 (six) hours as needed for nausea or vomiting. 05/28/15   Jenise V Bacon Menshew, PA-C  oseltamivir (TAMIFLU) 75 MG capsule Take 1 capsule (75 mg total) by mouth 2 (two) times daily. 07/19/16 07/24/16  Orvil FeilJaclyn M Garnet Chatmon, PA-C    Allergies Patient has no known allergies.  No family history on file.  Social History Social History  Substance Use Topics  . Smoking status: Never Smoker  . Smokeless tobacco: Not on file  . Alcohol use Yes     Comment: Occasionally    Review of Systems Constitutional: Has myalgias, fever and  fatigue. Eyes: No visual changes. ENT: No sore throat. Cardiovascular: Denies chest pain. Respiratory: Denies shortness of breath. Gastrointestinal: No abdominal pain.  No nausea, no vomiting. No constipation. Has diarrhea. Genitourinary: Negative for dysuria. Musculoskeletal: Negative for back pain. Skin: Negative for rash. Neurological: Negative for headaches, focal weakness or numbness.  10-point ROS otherwise negative.  ____________________________________________   PHYSICAL EXAM:  VITAL SIGNS: ED Triage Vitals [07/19/16 1511]  Enc Vitals Group     BP 115/71     Pulse Rate (!) 107     Resp 18     Temp 98.5 F (36.9 C)     Temp Source Oral     SpO2 100 %     Weight      Height      Head Circumference      Peak Flow      Pain Score 8     Pain Loc      Pain Edu?      Excl. in GC?    Constitutional: Alert and oriented. Patient is lying supine on the bed.  Eyes: Conjunctivae are normal. PERRL. EOMI. Head: Atraumatic. Nose: Nasal turbinates are erythematous. Mouth/Throat: Mucous membranes are moist.  Oropharynx non-erythematous. Neck: Anterior cervical lymphadenopathy palpable. Cardiovascular: Normal rate, regular rhythm. Grossly normal heart sounds.  Good peripheral circulation. Respiratory: Normal respiratory effort.  No retractions. Lungs CTAB. Gastrointestinal: Soft and nontender. No distention. No abdominal bruits. No CVA tenderness. Musculoskeletal: No lower extremity tenderness nor edema.  No joint effusions. Neurologic:  Normal speech and language. No  gross focal neurologic deficits are appreciated. No gait instability. Skin:  Skin is warm, dry and intact. No rash noted.  ____________________________________________   LABS (all labs ordered are listed, but only abnormal results are displayed)  Labs Reviewed  INFLUENZA PANEL BY PCR (TYPE A & B, H1N1)   ____________________________________________    PROCEDURES Procedures: Naproxen      ____________________________________________   INITIAL IMPRESSION / ASSESSMENT AND PLAN / ED COURSE  Pertinent labs & imaging results that were available during my care of the patient were reviewed by me and considered in my medical decision making (see chart for details).  Clinical Course    Assessment and Plan  Upper respiratory tract infection:  Patient has diarrhea, productive cough fatigue, and myalgias for one day. Influenza is suspected. Influenza panel was ordered. Patient was prescribed Tamiflu and naproxen. Strict return precautions were given. All patient questions were answered.  ____________________________________________   FINAL CLINICAL IMPRESSION(S) / ED DIAGNOSES  Final diagnoses:  Viral upper respiratory tract infection      NEW MEDICATIONS STARTED DURING THIS VISIT:  New Prescriptions   NAPROXEN (EC NAPROSYN) 500 MG EC TABLET    Take 1 tablet (500 mg total) by mouth 2 (two) times daily with a meal.   OSELTAMIVIR (TAMIFLU) 75 MG CAPSULE    Take 1 capsule (75 mg total) by mouth 2 (two) times daily.     Note:  This document was prepared using Dragon voice recognition software and may include unintentional dictation errors.    Orvil FeilJaclyn M Hyla Coard, PA-C 07/19/16 1646    Jene Everyobert Kinner, MD 07/19/16 [phone]

## 2016-07-19 NOTE — ED Triage Notes (Signed)
Had fever of 100.1 last night, body aches, productive cough, headache, congestion since yesterday,

## 2018-02-10 IMAGING — CR DG FOOT COMPLETE 3+V*R*
1 series · 3 of 3 positions shown · non-contrast
Comparison: None.

CLINICAL DATA: 20-year-old female with a history of toe injury.

EXAM:
RIGHT FOOT COMPLETE - 3+ VIEW

[Series 1: dg foot complete right · 0.14mm/px · 3 of 3 slices shown]
[im 1/3]
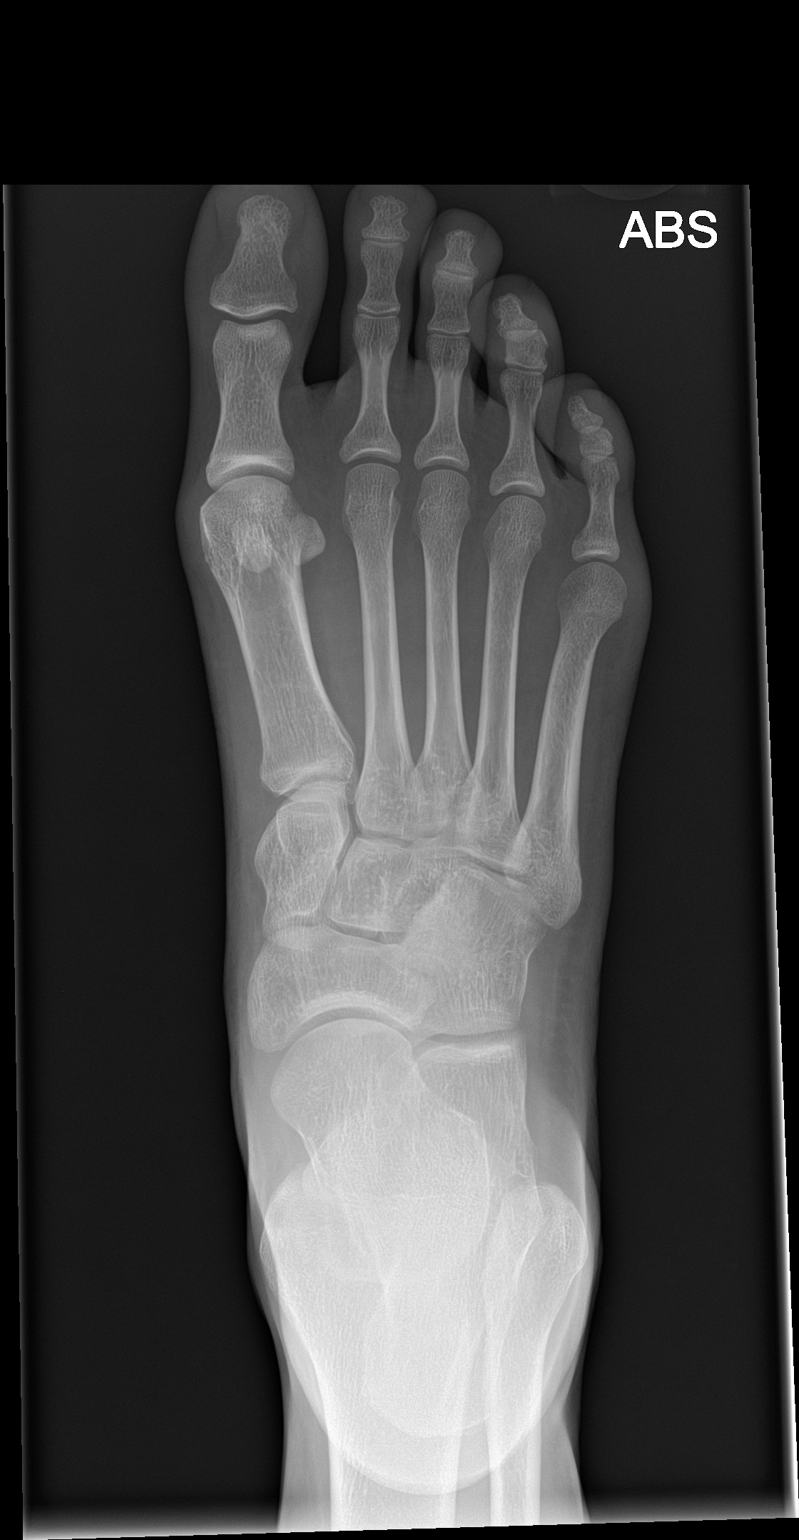
[im 2/3]
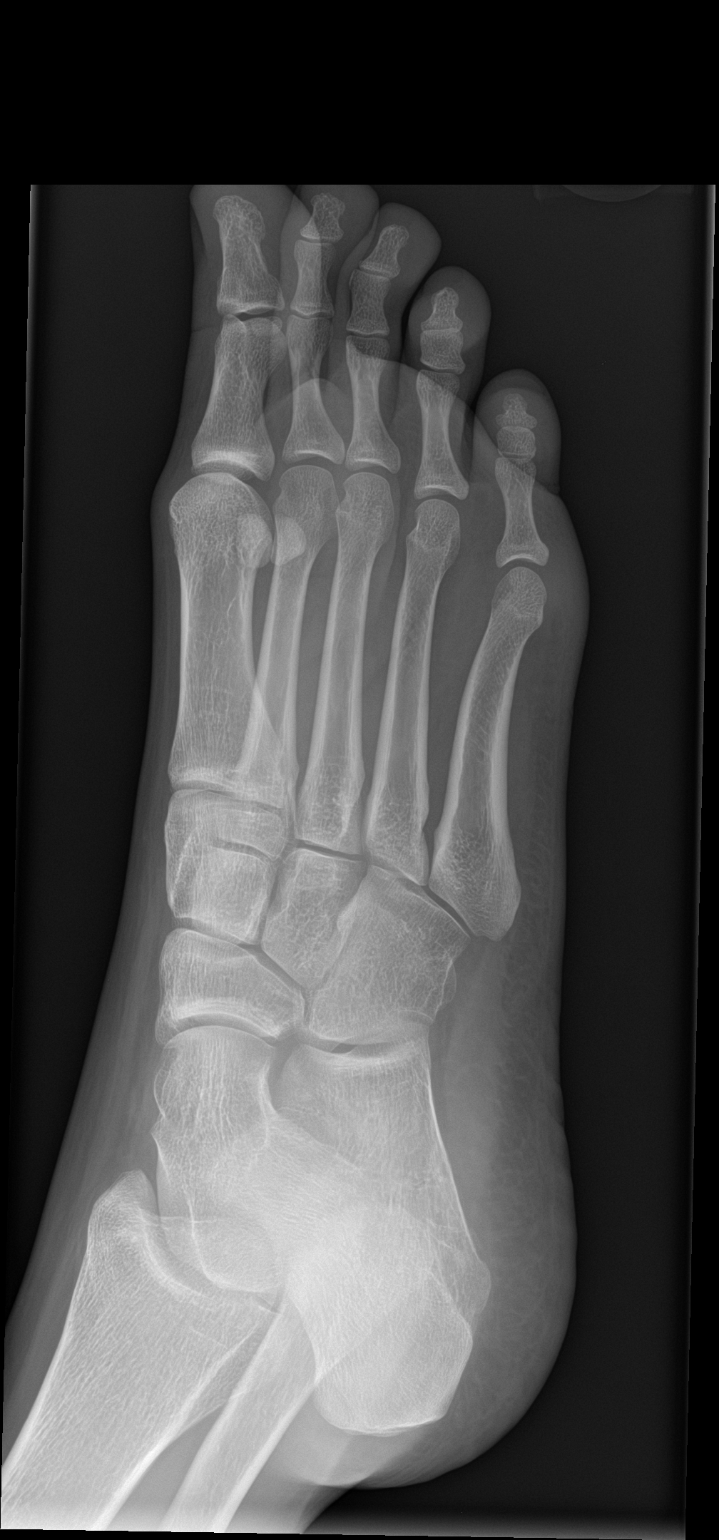
[im 3/3]
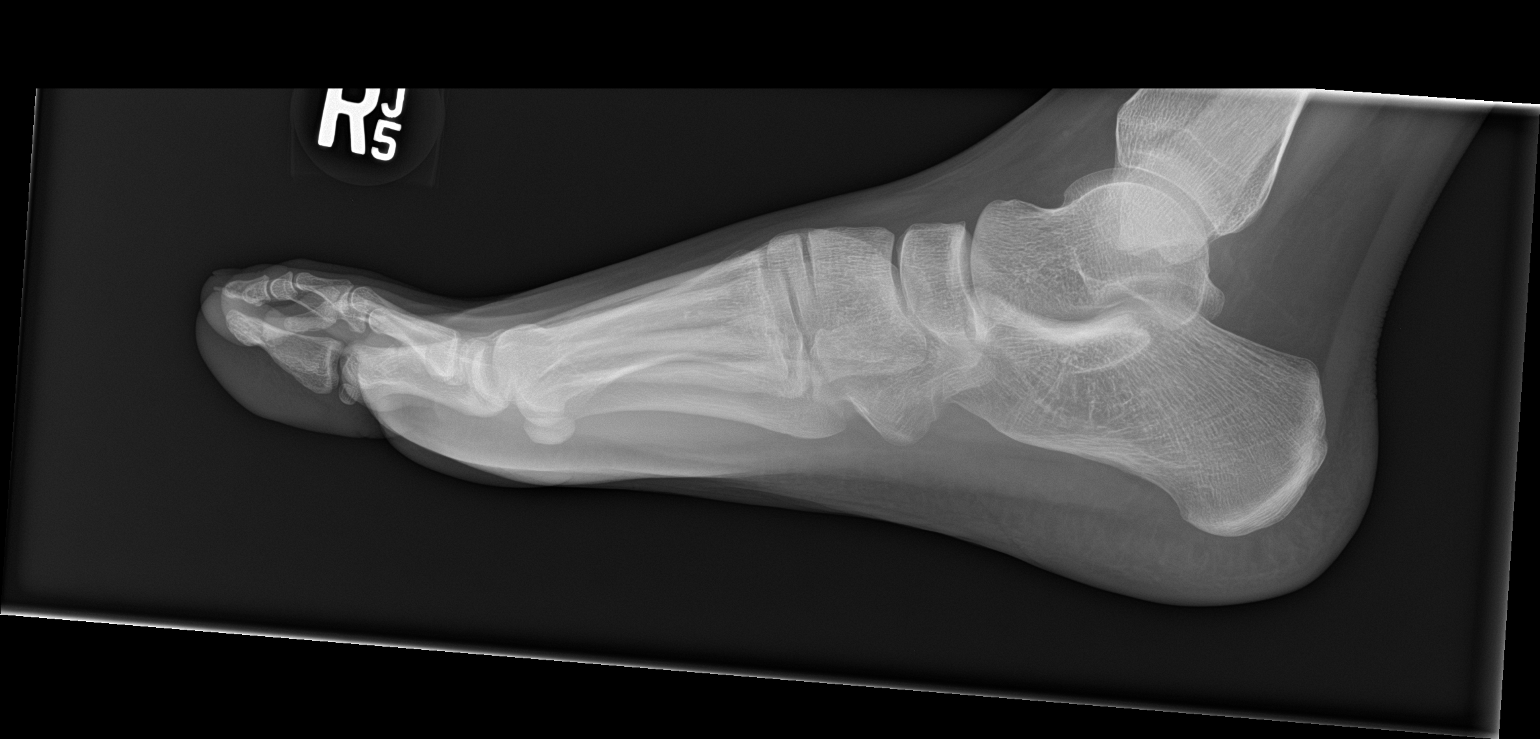

[3 of 3 positions shown; findings below may reference images not displayed]

FINDINGS: There is no evidence of fracture or dislocation. There is no
evidence of arthropathy or other focal bone abnormality. Soft
tissues are unremarkable.
IMPRESSION: Negative for acute bony abnormality.
